# Patient Record
Sex: Female | Born: 1975 | Race: Black or African American | Hispanic: No | Marital: Single | Smoking: Never smoker
Health system: Southern US, Community
[De-identification: ages and names within clinical notes are randomized; demographics above are authoritative.]

---

## 2004-12-12 ENCOUNTER — Emergency Department: Payer: Self-pay | Admitting: Emergency Medicine

## 2006-07-26 ENCOUNTER — Emergency Department: Payer: Self-pay | Admitting: Emergency Medicine

## 2006-10-06 ENCOUNTER — Emergency Department: Payer: Self-pay | Admitting: Emergency Medicine

## 2007-11-14 HISTORY — PX: BREAST SURGERY: SHX581

## 2008-05-12 ENCOUNTER — Ambulatory Visit: Payer: Self-pay

## 2008-06-11 ENCOUNTER — Ambulatory Visit: Payer: Self-pay | Admitting: General Surgery

## 2009-06-27 ENCOUNTER — Emergency Department: Payer: Self-pay | Admitting: Internal Medicine

## 2009-09-13 ENCOUNTER — Emergency Department: Payer: Self-pay | Admitting: Emergency Medicine

## 2009-12-16 ENCOUNTER — Ambulatory Visit: Payer: Self-pay | Admitting: Family Medicine

## 2010-03-06 ENCOUNTER — Emergency Department: Payer: Self-pay | Admitting: Internal Medicine

## 2011-03-08 ENCOUNTER — Emergency Department: Payer: Self-pay | Admitting: Emergency Medicine

## 2012-02-15 ENCOUNTER — Emergency Department: Payer: Self-pay | Admitting: Emergency Medicine

## 2012-02-15 LAB — COMPREHENSIVE METABOLIC PANEL
Albumin: 3.5 g/dL (ref 3.4–5.0)
Alkaline Phosphatase: 45 U/L — ABNORMAL LOW (ref 50–136)
Anion Gap: 10 (ref 7–16)
Calcium, Total: 9.4 mg/dL (ref 8.5–10.1)
EGFR (Non-African Amer.): >60
Glucose: 65 mg/dL (ref 65–99)
Osmolality: 271 (ref 275–301)
SGOT(AST): 21 U/L (ref 15–37)
SGPT (ALT): 19 U/L
Sodium: 137 mmol/L (ref 136–145)

## 2012-02-15 LAB — CBC
MCH: 26 pg (ref 26.0–34.0)
MCHC: 32.3 g/dL (ref 32.0–36.0)
MCV: 81 fL (ref 80–100)
Platelet: 304 10*3/uL (ref 150–440)
RBC: 4.18 10*6/uL (ref 3.80–5.20)
WBC: 9.2 10*3/uL (ref 3.6–11.0)

## 2012-02-15 LAB — LIPASE, BLOOD: Lipase: 77 U/L (ref 73–393)

## 2012-02-15 LAB — HCG, QUANTITATIVE, PREGNANCY: Beta Hcg, Quant.: 165220 m[IU]/mL — ABNORMAL HIGH

## 2012-02-15 LAB — PROTIME-INR: Prothrombin Time: 13.5 secs (ref 11.5–14.7)

## 2012-03-09 ENCOUNTER — Emergency Department: Payer: Self-pay | Admitting: Emergency Medicine

## 2012-03-09 LAB — PREGNANCY, URINE: Pregnancy Test, Urine: POSITIVE m[IU]/mL

## 2012-03-09 LAB — URINALYSIS, COMPLETE
Blood: NEGATIVE
Leukocyte Esterase: NEGATIVE
Nitrite: NEGATIVE
Ph: 5 (ref 4.5–8.0)
Protein: 30
Specific Gravity: 1.027 (ref 1.003–1.030)
Squamous Epithelial: 3

## 2012-09-03 ENCOUNTER — Observation Stay: Payer: Self-pay

## 2012-09-05 ENCOUNTER — Ambulatory Visit: Payer: Self-pay | Admitting: Obstetrics and Gynecology

## 2012-09-05 LAB — CBC WITH DIFFERENTIAL/PLATELET
Basophil %: 0.4 %
Eosinophil #: 0 10*3/uL (ref 0.0–0.7)
HCT: 26.6 % — ABNORMAL LOW (ref 35.0–47.0)
HGB: 8.2 g/dL — ABNORMAL LOW (ref 12.0–16.0)
MCH: 20.9 pg — ABNORMAL LOW (ref 26.0–34.0)
MCHC: 30.8 g/dL — ABNORMAL LOW (ref 32.0–36.0)
MCV: 68 fL — ABNORMAL LOW (ref 80–100)
Monocyte #: 0.6 x10 3/mm (ref 0.2–0.9)
Monocyte %: 5.5 %
Neutrophil #: 9.4 10*3/uL — ABNORMAL HIGH (ref 1.4–6.5)

## 2012-09-06 ENCOUNTER — Inpatient Hospital Stay: Payer: Self-pay

## 2012-09-07 LAB — HEMATOCRIT: HCT: 25 % — ABNORMAL LOW (ref 35.0–47.0)

## 2012-10-08 ENCOUNTER — Emergency Department: Payer: Self-pay | Admitting: Emergency Medicine

## 2013-06-15 ENCOUNTER — Emergency Department: Payer: Self-pay | Admitting: Emergency Medicine

## 2015-03-02 NOTE — Op Note (Signed)
PATIENT NAME:  Martha Pittman, Martha Pittman MR#:  [id number] DATE OF BIRTH:  31-May-1976  DATE OF PROCEDURE:  09/06/2012  PREOPERATIVE DIAGNOSES:  1. Intrauterine pregnancy at [redacted] weeks gestational age.  2. History of previous Cesarean section, desires repeat.   POSTOPERATIVE DIAGNOSES: 1. Intrauterine pregnancy at [redacted] weeks gestational age.  2. History of previous Cesarean section, desires repeat.   PROCEDURE: Repeat low transverse Cesarean section via Pfannenstiel incision.   ANESTHESIA USED: Spinal.   SURGEON: Conard Novak, MD   ASSISTANT SURGEON: Annamarie Major, MD   ESTIMATED BLOOD LOSS: 500 mL.  OPERATIVE FLUIDS: 1000 mL Crystalloid.   COMPLICATIONS: None.   FINDINGS:  1. Minimal adhesions.  2. Normal appearing gravid uterus, fallopian tubes, and ovaries.   SPECIMENS: None.   CONDITION: Stable at the end of the procedure.   INDICATION FOR PROCEDURE: [name] is a 39 year old G22 P2-0-1-2 female with an estimated due date of 11/01 who desires a repeat Cesarean section after having undergone two prior Cesarean deliveries. For this she was taken to the operating room for abdominal delivery.   PROCEDURE IN DETAIL: The patient was taken to the operating room where spinal anesthesia was administered and found to be adequate. She was placed in the dorsal supine position with a leftward tilt and prepped and draped in the usual sterile fashion. A Pfannenstiel incision was made and carried through the various layers until the peritoneum was identified and entered sharply. The peritoneal incision was extended in cranial and caudal directions using tension. A bladder flap was created and the bladder retractor was placed to move the bladder out of the operative field of interest. A low transverse hysterotomy was made with the scalpel and extended in the lateral direction using cranial and caudal tension. The fetal vertex was then grasped, elevated to the hysterotomy, and delivered followed by the  shoulders and the rest of the body without difficulty. The cord was clamped and then cut and the infant was handed to the awaiting pediatrician. Cord blood was collected. The placenta was then removed and the uterus was exteriorized and cleared of all clot and debris. The hysterotomy was then closed using #0 Vicryl in a running locked fashion. A second layer of the same suture was used to obtain hemostasis. The uterus was returned to the abdomen and the gutters were cleared of all clots and debris. The rectus muscles and peritoneum were reapproximated using a single figure-of-eight stitch using a #0 Vicryl suture.   The On-Q pump system was then installed according to the manufacturer's recommendations. The catheters were inserted approximately 4 cm cephalad to the incision approximately 2 cm apart straddling the midline. The catheters were inserted to a depth of just superficial to the rectus abdominis muscles and just deep to the rectus fascia. The catheters were inserted to a depth of the third mark on the catheters.   With care to avoid the catheters, the fascia was closed using #0 Maxon using two separate sutures starting at the apices and meeting in the midline where the two sutures were tied. The skin was closed using the Insorb subcutaneous stapler.   The skin incision was then covered using Dermabond. The On-Q catheters were affixed to the skin using Dermabond followed by both Steri-Strips as well as Tegaderm. The On-Q catheters were bolused with 5 mL of 0.5% Marcaine prior to the end of procedure.   The patient tolerated the procedure well. Sponge, lap, and needle counts were correct x2. For VTE prophylaxis, the patient  had on compression hose and pneumatic compression stockings throughout the entire procedure. For antibiotics the patient received 2 grams of Ancef prior to skin incision.  ____________________________ Conard NovakStephen D. Duquan Gillooly, MD sdj:drc D: 09/06/2012 10:24:42 ET T: 09/06/2012  10:38:18 ET JOB#: [id number] Conard NovakSTEPHEN D Nemiah Bubar MD ELECTRONICALLY SIGNED 10/01/2012 22:41

## 2015-03-23 NOTE — H&P (Signed)
L&D Evaluation:  History:   HPI 39 year old BF G4 P20012  with EDC=09/13/2012 by an 11 week ultrasound presents at 4738 3/7 weeks with c/o pelvic pain/pressure that started yesterday. Movemnt and sitting tends to make the pain worse. She feels some tightening, but contractions not painful. Denies VB, LOF, dysuria, diarrhea. Has a hx of C-section x2 and repeat C-section scheduled for 10/25. She also states that she fell on her hands and knees last night when getting out of bed. [facility], AMA with negative first trimester screen, normal anatomy scan, and positive GBS    Presents with pelvic pressure and pain    Patient's Medical History No Chronic Illness    Patient's Surgical History Previous C-Section  x2, and breast biopsy.    Medications Pre [place]atal Vitamins    Social History none   Exam:   Vital Signs stable  115/68    General no apparent distress    Mental Status clear    Chest clear    Heart normal sinus rhythm, no murmur/gallop/rubs    Abdomen gravid, non-tender    Estimated Fetal Weight Average for gestational age    Fetal Position cephalic    Pelvic no external lesions, closed/50%/-3-no change over three hours    Mebranes Intact    FHT normal rate with no decels, 130s with accels to 150s-170, reactive NST    FHT Description CAT 1    Fetal Heart Rate 135    Ucx initially q5-7 min and mild, spaced out with IV hydration and one dose of terbutaline    Skin dry   Impression:   Impression Pelvic pain/pressure. Not in labor   Plan:   Plan DC home with labor precautions. Advised to stop working and avoid prolonged sitting and standing.FU in two days for proeop visit.   Electronic Signatures: Trinna BalloonGutierrez, Imanol Bihl L (CNM)  (Signed 22-Oct-13 18:19)  Authored: L&D Evaluation   Last Updated: 22-Oct-13 18:19 by Trinna BalloonGutierrez, Raymone Pembroke L (CNM)

## 2016-03-14 ENCOUNTER — Emergency Department
Admission: EM | Admit: 2016-03-14 | Discharge: 2016-03-14 | Disposition: A | Discharge status: Home / Self Care | Payer: BC Managed Care – PPO | Attending: Emergency Medicine | Admitting: Emergency Medicine

## 2016-03-14 ENCOUNTER — Encounter: Payer: Self-pay | Admitting: *Deleted

## 2016-03-14 DIAGNOSIS — M549 Dorsalgia, unspecified: Secondary | ICD-10-CM | POA: Insufficient documentation

## 2016-03-14 DIAGNOSIS — Z5321 Procedure and treatment not carried out due to patient leaving prior to being seen by health care provider: Secondary | ICD-10-CM | POA: Diagnosis not present

## 2016-03-14 NOTE — ED Notes (Signed)
No answer when called from lobby 

## 2016-03-14 NOTE — ED Provider Notes (Signed)
-----------------------------------------   4:44 AM on 03/14/2016 -----------------------------------------  Patient left without being seen. There was no answer when she was called from the lobby.  Irean HongJade J Sung, MD 03/14/16 [phone]
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
## 2016-07-18 ENCOUNTER — Encounter (HOSPITAL_COMMUNITY): Payer: Self-pay

## 2016-07-18 ENCOUNTER — Emergency Department (HOSPITAL_COMMUNITY)
Admission: EM | Admit: 2016-07-18 | Discharge: 2016-07-18 | Disposition: A | Discharge status: Home / Self Care | Payer: BC Managed Care – PPO | Attending: Emergency Medicine | Admitting: Emergency Medicine

## 2016-07-18 ENCOUNTER — Emergency Department (HOSPITAL_COMMUNITY): Payer: BC Managed Care – PPO

## 2016-07-18 DIAGNOSIS — S0990XA Unspecified injury of head, initial encounter: Secondary | ICD-10-CM | POA: Diagnosis present

## 2016-07-18 DIAGNOSIS — T148XXA Other injury of unspecified body region, initial encounter: Secondary | ICD-10-CM

## 2016-07-18 DIAGNOSIS — Y999 Unspecified external cause status: Secondary | ICD-10-CM | POA: Insufficient documentation

## 2016-07-18 DIAGNOSIS — Y939 Activity, unspecified: Secondary | ICD-10-CM | POA: Diagnosis not present

## 2016-07-18 DIAGNOSIS — S0081XA Abrasion of other part of head, initial encounter: Secondary | ICD-10-CM | POA: Insufficient documentation

## 2016-07-18 DIAGNOSIS — Y92410 Unspecified street and highway as the place of occurrence of the external cause: Secondary | ICD-10-CM | POA: Diagnosis not present

## 2016-07-18 MED ORDER — IBUPROFEN 400 MG PO TABS
600.0000 mg | ORAL_TABLET | Freq: Once | ORAL | Status: AC
  Administered 2016-07-18: 600 mg via ORAL
  Filled 2016-07-18: qty 1
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
## 2016-07-18 NOTE — ED Notes (Signed)
EDP at bedside  

## 2016-07-18 NOTE — ED Notes (Signed)
Patient transported to CT 
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
## 2019-12-17 ENCOUNTER — Other Ambulatory Visit: Payer: Self-pay

## 2019-12-17 ENCOUNTER — Emergency Department
Admission: EM | Admit: 2019-12-17 | Discharge: 2019-12-17 | Disposition: A | Discharge status: Home / Self Care | Payer: BC Managed Care – PPO | Attending: Emergency Medicine | Admitting: Emergency Medicine

## 2019-12-17 DIAGNOSIS — N390 Urinary tract infection, site not specified: Secondary | ICD-10-CM | POA: Insufficient documentation

## 2019-12-17 DIAGNOSIS — M7918 Myalgia, other site: Secondary | ICD-10-CM | POA: Insufficient documentation

## 2019-12-17 DIAGNOSIS — Z20822 Contact with and (suspected) exposure to covid-19: Secondary | ICD-10-CM | POA: Insufficient documentation

## 2019-12-17 DIAGNOSIS — R509 Fever, unspecified: Secondary | ICD-10-CM | POA: Diagnosis present

## 2019-12-17 LAB — URINALYSIS, COMPLETE (UACMP) WITH MICROSCOPIC
Bilirubin Urine: NEGATIVE
Glucose, UA: NEGATIVE mg/dL
Hgb urine dipstick: NEGATIVE
Ketones, ur: NEGATIVE mg/dL
Nitrite: NEGATIVE
Protein, ur: 100 mg/dL — AB
Specific Gravity, Urine: 1.025 (ref 1.005–1.030)
WBC, UA: >50 WBC/hpf — ABNORMAL HIGH (ref 0–5)
pH: 5 (ref 5.0–8.0)

## 2019-12-17 LAB — INFLUENZA PANEL BY PCR (TYPE A & B)
Influenza A By PCR: NEGATIVE
Influenza B By PCR: NEGATIVE

## 2019-12-17 MED ORDER — CEPHALEXIN 500 MG PO CAPS: 500.0000 mg | ORAL_CAPSULE | Freq: Three times a day (TID) | ORAL | 0 refills | Status: DC

## 2019-12-17 MED ORDER — ONDANSETRON 4 MG PO TBDP: 4.0000 mg | ORAL_TABLET | Freq: Three times a day (TID) | ORAL | 0 refills | Status: DC | PRN

## 2019-12-17 MED ORDER — ONDANSETRON 4 MG PO TBDP
4.0000 mg | ORAL_TABLET | Freq: Once | ORAL | Status: AC
  Administered 2019-12-17: 4 mg via ORAL
  Filled 2019-12-17: qty 1

## 2019-12-17 NOTE — ED Triage Notes (Signed)
Pt c/o nausea, fever, chills since Sunday and was tested negative for covid on Monday. Pt is in NAD.

## 2019-12-17 NOTE — ED Notes (Signed)
Patient is complaining of fever and body aches since Sunday.  Patient states temp was 103 on Sunday.  Patient reports cough that occasionally causes left lower abdominal pain.  Patient reports some nausea, denies vomiting and diarrhea.  Denies loss of taste and smell.  Denies sore throat.

## 2019-12-17 NOTE — ED Provider Notes (Signed)
[facility] Emergency Department Provider Note   ____________________________________________   First MD Initiated Contact with Patient 12/17/19 1031     (approximate)  I have reviewed the triage vital signs and the nursing notes.   HISTORY  Chief Complaint URI    HPI Martha Pittman is a 44 y.o. female presents to the ED with complaint of fever, body aches and temp of 103 4 days ago.  Patient states that she was tested Monday for Covid and was negative.  Patient states that this morning she is able to drink fluids but continues to be nauseous without vomiting.  She denies any changes in taste or smell.  There is been no diarrhea.  She is unaware of any exposure to Covid.  She rates her pain as an 8 out of 10.     History reviewed. No pertinent past medical history.  There are no problems to display for this patient.   Past Surgical History:  Procedure Laterality Date  . CESAREAN SECTION      Prior to Admission medications   Medication Sig Start Date End Date Taking? Authorizing Provider  cephALEXin (KEFLEX) 500 MG capsule Take 1 capsule (500 mg total) by mouth 3 (three) times daily. 12/17/19   Tommi Rumps, PA-C  ondansetron (ZOFRAN ODT) 4 MG disintegrating tablet Take 1 tablet (4 mg total) by mouth every 8 (eight) hours as needed for nausea or vomiting. 12/17/19   Tommi Rumps, PA-C    Allergies Patient has no known allergies.  No family history on file.  Social History Social History   Tobacco Use  . Smoking status: Never Smoker  . Smokeless tobacco: Never Used  Substance Use Topics  . Alcohol use: Yes    Comment: rare  . Drug use: No    Review of Systems Constitutional: Positive fever/chills Eyes: No visual changes. ENT: No sore throat. Cardiovascular: Denies chest pain. Respiratory: Denies shortness of breath.  Occasional cough. Gastrointestinal: No abdominal pain.  Positive nausea, no vomiting.  No diarrhea.  No  constipation. Genitourinary: Negative for dysuria. Musculoskeletal: Positive for body aches. Skin: Negative for rash. Neurological: Negative for headaches, focal weakness or numbness. ____________________________________________   PHYSICAL EXAM:  VITAL SIGNS: ED Triage Vitals  Enc Vitals Group     BP 12/17/19 0948 103/65     Pulse Rate 12/17/19 0948 (!) 118     Resp 12/17/19 0948 18     Temp 12/17/19 0948 99.5 F (37.5 C)     Temp Source 12/17/19 0948 Oral     SpO2 12/17/19 0948 98 %     Weight 12/17/19 0949 150 lb (68 kg)     Height 12/17/19 0949 5' (1.524 m)     Head Circumference --      Peak Flow --      Pain Score 12/17/19 0949 8     Pain Loc --      Pain Edu? --      Excl. in GC? --    Constitutional: Alert and oriented. Well appearing and in no acute distress. Eyes: Conjunctivae are normal.  Head: Atraumatic. Neck: No stridor.   Cardiovascular: Normal rate, regular rhythm. Grossly normal heart sounds.  Good peripheral circulation. Respiratory: Normal respiratory effort.  No retractions. Lungs CTAB. Gastrointestinal: Soft and nontender. No distention.  No CVA tenderness. Musculoskeletal: Moves upper and lower extremities any difficulty.  Normal gait was noted. Neurologic:  Normal speech and language. No gross focal neurologic deficits are appreciated. No  gait instability. Skin:  Skin is warm, dry and intact. No rash noted. Psychiatric: Mood and affect are normal. Speech and behavior are normal.  ____________________________________________   LABS (all labs ordered are listed, but only abnormal results are displayed)  Labs Reviewed  URINALYSIS, COMPLETE (UACMP) WITH MICROSCOPIC - Abnormal; Notable for the following components:      Result Value   Color, Urine AMBER (*)    APPearance TURBID (*)    Protein, ur 100 (*)    Leukocytes,Ua MODERATE (*)    WBC, UA >50 (*)    Bacteria, UA RARE (*)    Non Squamous Epithelial PRESENT (*)    All other components  within normal limits  INFLUENZA PANEL BY PCR (TYPE A & B)    PROCEDURES  Procedure(s) performed (including Critical Care):  Procedures   ____________________________________________   INITIAL IMPRESSION / ASSESSMENT AND PLAN / ED COURSE  As part of my medical decision making, I reviewed the following data within the electronic MEDICAL RECORD NUMBER Notes from prior ED visits and [place] Controlled Substance Database  Martha Pittman was evaluated in Emergency Department on 12/17/2019 for the symptoms described in the history of present illness. She was evaluated in the context of the global COVID-19 pandemic, which necessitated consideration that the patient might be at risk for infection with the SARS-CoV-2 virus that causes COVID-19. Institutional protocols and algorithms that pertain to the evaluation of patients at risk for COVID-19 are in a state of rapid change based on information released by regulatory bodies including the CDC and federal and state organizations. These policies and algorithms were followed during the patient's care in the ED.  44 year old female presents to the ED with complaint of 4 days of fever, body aches and nausea without vomiting.  She had a Covid test done on Monday which was negative.  She is unaware of any Covid exposure.  Patient continues to have nausea without vomiting.  Influenza test was negative.  Urinalysis is consistent with an acute UTI.  Patient was made aware.  She was given Zofran while in the ED and is feeling much better.  A prescription for Keflex 500 mg 3 times daily and Zofran as needed was sent to her pharmacy.  She is encouraged to increase fluids and continue Tylenol as needed.  She will follow-up with her PCP if any continued problems and return to the emergency department if any severe worsening of her symptoms.   ____________________________________________   FINAL CLINICAL IMPRESSION(S) / ED DIAGNOSES  Final diagnoses:  Acute urinary tract  infection     ED Discharge Orders         Ordered    cephALEXin (KEFLEX) 500 MG capsule  3 times daily     12/17/19 1257    ondansetron (ZOFRAN ODT) 4 MG disintegrating tablet  Every 8 hours PRN     12/17/19 1257           Note:  This document was prepared using Dragon voice recognition software and may include unintentional dictation errors.    Johnn Hai, PA-C 12/17/19 1303    Lavonia Drafts, MD 12/17/19 1304

## 2019-12-17 NOTE — Discharge Instructions (Signed)
Follow-up with your primary care provider if any continued problems or concerns.  Increase fluids.  Tylenol or ibuprofen if fever or body aches.  Begin taking antibiotic for the next 10 days until completely finished.  The Zofran can be used every 8 hours if needed for nausea.  Increase fluids.  If any severe worsening of your symptoms such as fever, chills, back pain or inability to take the antibiotics return to the emergency department.

## 2020-02-29 NOTE — Progress Notes (Deleted)
PCP:  [facility]   No chief complaint on file.  ?NP> 3  HPI:      [name] is a 44 y.o. No obstetric history on file. who LMP was No LMP recorded., presents today for her NP> 3 yrs annual examination.  Her menses are {norm/abn:715}, lasting {number:22536} days.  Dysmenorrhea {dysmen:716}. She {does:18564} have intermenstrual bleeding.  Sex activity: {sex active:315163}.  Last Pap: {QMGQ:676195093}  Results were: {norm/abn:16707::"no abnormalities"} /neg HPV DNA *** Hx of STDs: {STD hx:14358}  Last mammogram: {date:304500300}  Results were: {norm/abn:13465} There is no FH of breast cancer. There is no FH of ovarian cancer. The patient {does:18564} do self-breast exams.  Tobacco use: {tob:20664} Alcohol use: {Alcohol:11675} No drug use.  Exercise: {exercise:31265}  She {does:18564} get adequate calcium and Vitamin D in her diet.   No past medical history on file.  Past Surgical History:  Procedure Laterality Date  . BREAST SURGERY  2009   Cyst removed from left breast  . CESAREAN SECTION      Family History  Problem Relation Age of Onset  . Breast cancer Mother 62  . Lung cancer Maternal Grandmother     Social History   Socioeconomic History  . Marital status: Single    Spouse name: Not on file  . Number of children: Not on file  . Years of education: Not on file  . Highest education level: Not on file  Occupational History  . Not on file  Tobacco Use  . Smoking status: Never Smoker  . Smokeless tobacco: Never Used  Substance and Sexual Activity  . Alcohol use: Yes    Comment: rare  . Drug use: No  . Sexual activity: Not on file  Other Topics Concern  . Not on file  Social History Narrative  . Not on file   Social Determinants of Health   Financial Resource Strain:   . Difficulty of Paying Living Expenses:   Food Insecurity:   . Worried About Programme researcher, broadcasting/film/video in the Last Year:   . Barista in the Last Year:     Transportation Needs:   . Freight forwarder (Medical):   Marland Kitchen Lack of Transportation (Non-Medical):   Physical Activity:   . Days of Exercise per Week:   . Minutes of Exercise per Session:   Stress:   . Feeling of Stress :   Social Connections:   . Frequency of Communication with Friends and Family:   . Frequency of Social Gatherings with Friends and Family:   . Attends Religious Services:   . Active Member of Clubs or Organizations:   . Attends Banker Meetings:   Marland Kitchen Marital Status:   Intimate Partner Violence:   . Fear of Current or Ex-Partner:   . Emotionally Abused:   Marland Kitchen Physically Abused:   . Sexually Abused:      Current Outpatient Medications:  .  cephALEXin (KEFLEX) 500 MG capsule, Take 1 capsule (500 mg total) by mouth 3 (three) times daily., Disp: 30 capsule, Rfl: 0 .  ondansetron (ZOFRAN ODT) 4 MG disintegrating tablet, Take 1 tablet (4 mg total) by mouth every 8 (eight) hours as needed for nausea or vomiting., Disp: 8 tablet, Rfl: 0     ROS:  Review of Systems BREAST: No symptoms   Objective: There were no vitals taken for this visit.   OBGyn Exam  Results: No results found for this or any previous visit (from the  past 24 hour(s)).  Assessment/Plan: No diagnosis found.  No orders of the defined types were placed in this encounter.            GYN counsel {counseling:16159}     F/U  No follow-ups on file.  Lex Linhares B. Samantha Ragen, PA-C 02/29/2020 6:21 PM

## 2020-03-01 ENCOUNTER — Ambulatory Visit: Payer: Self-pay | Admitting: Obstetrics and Gynecology

## 2020-03-16 NOTE — Patient Instructions (Addendum)
I value your feedback and entrusting us with your care. If you get a  patient survey, I would appreciate you taking the time to let us know about your experience today. Thank you!  As of October 23, 2019, your lab results will be released to your MyChart immediately, before I even have a chance to see them. Please give me time to review them and contact you if there are any abnormalities. Thank you for your patience.   [facility]: [phone]  [facility]: [phone]  

## 2020-03-16 NOTE — Progress Notes (Signed)
PCP:  [facility]   Chief Complaint  Patient presents with  . Gynecologic Exam     HPI:      [name] is a 44 y.o. No obstetric history on file. who LMP was Patient's last menstrual period was 03/03/2020 (approximate)., presents today for her NP> 3 yrs annual examination.  Her menses are regular every 28-30 days, lasting 3 days.  Dysmenorrhea none. She does not have intermenstrual bleeding.  Sex activity: not sexually active for many yrs. Mirena placed 11/05/12. Pt went to [facility] to have it removed and since strings not visible, was told it had come out on its own. No GYN u/s done.  Last Pap: not recent; no hx of abn  Last mammogram: 2011  Results were: normal--routine follow-up in 12 months There is a FH of breast cancer in her mom, genetic testing not indicated. There is no FH of ovarian cancer. The patient does do self-breast exams.  Tobacco use: The patient denies current or previous tobacco use. Alcohol use: social drinker No drug use.  Exercise: moderately active  She does get adequate calcium but not Vitamin D in her diet.  No recent fasting labs.   History reviewed. No pertinent past medical history.  Past Surgical History:  Procedure Laterality Date  . BREAST SURGERY  2009   Cyst removed from left breast  . CESAREAN SECTION       Family History  Problem Relation Age of Onset  . Breast cancer Mother 77  . Lung cancer Maternal Grandmother   . Ovarian cancer Neg Hx     Social History   Socioeconomic History  . Marital status: Single    Spouse name: Not on file  . Number of children: Not on file  . Years of education: Not on file  . Highest education level: Not on file  Occupational History  . Not on file  Tobacco Use  . Smoking status: Never Smoker  . Smokeless tobacco: Never Used  Substance and Sexual Activity  . Alcohol use: Yes    Comment: rare  . Drug use: No  . Sexual activity: Yes    Birth control/protection: None    Other Topics Concern  . Not on file  Social History Narrative  . Not on file   Social Determinants of Health   Financial Resource Strain:   . Difficulty of Paying Living Expenses:   Food Insecurity:   . Worried About Programme researcher, broadcasting/film/video in the Last Year:   . Barista in the Last Year:   Transportation Needs:   . Freight forwarder (Medical):   Marland Kitchen Lack of Transportation (Non-Medical):   Physical Activity:   . Days of Exercise per Week:   . Minutes of Exercise per Session:   Stress:   . Feeling of Stress :   Social Connections:   . Frequency of Communication with Friends and Family:   . Frequency of Social Gatherings with Friends and Family:   . Attends Religious Services:   . Active Member of Clubs or Organizations:   . Attends Banker Meetings:   Marland Kitchen Marital Status:   Intimate Partner Violence:   . Fear of Current or Ex-Partner:   . Emotionally Abused:   Marland Kitchen Physically Abused:   . Sexually Abused:     No current outpatient medications on file.   ROS:  Review of Systems  Constitutional: Negative for fatigue, fever and unexpected weight change.  Respiratory:  Negative for cough, shortness of breath and wheezing.   Cardiovascular: Negative for chest pain, palpitations and leg swelling.  Gastrointestinal: Negative for blood in stool, constipation, diarrhea, nausea and vomiting.  Endocrine: Negative for cold intolerance, heat intolerance and polyuria.  Genitourinary: Negative for dyspareunia, dysuria, flank pain, frequency, genital sores, hematuria, menstrual problem, pelvic pain, urgency, vaginal bleeding, vaginal discharge and vaginal pain.  Musculoskeletal: Negative for back pain, joint swelling and myalgias.  Skin: Negative for rash.  Neurological: Negative for dizziness, syncope, light-headedness, numbness and headaches.  Hematological: Negative for adenopathy.  Psychiatric/Behavioral: Negative for agitation, confusion, sleep disturbance and  suicidal ideas. The patient is not nervous/anxious.   BREAST: No symptoms   Objective: BP 114/70   Ht 4\' 11"  (1.499 m)   Wt 168 lb (76.2 kg)   LMP 03/03/2020 (Approximate)   BMI 33.93 kg/m    Physical Exam Constitutional:      Appearance: She is well-developed.  Genitourinary:     Vulva, vagina, cervix, uterus, right adnexa and left adnexa normal.     No vulval lesion or tenderness noted.     No vaginal discharge, erythema or tenderness.     No cervical polyp.     Uterus is not enlarged or tender.     No right or left adnexal mass present.     Right adnexa not tender.     Left adnexa not tender.  Neck:     Thyroid: No thyromegaly.  Cardiovascular:     Rate and Rhythm: Normal rate and regular rhythm.     Heart sounds: Normal heart sounds. No murmur.  Pulmonary:     Effort: Pulmonary effort is normal.     Breath sounds: Normal breath sounds.  Chest:     Breasts:        Right: No mass, nipple discharge, skin change or tenderness.        Left: No mass, nipple discharge, skin change or tenderness.  Abdominal:     Palpations: Abdomen is soft.     Tenderness: There is no abdominal tenderness. There is no guarding.  Musculoskeletal:        General: Normal range of motion.     Cervical back: Normal range of motion.  Neurological:     General: No focal deficit present.     Mental Status: She is alert and oriented to person, place, and time.     Cranial Nerves: No cranial nerve deficit.  Skin:    General: Skin is warm and dry.  Psychiatric:        Mood and Affect: Mood normal.        Behavior: Behavior normal.        Thought Content: Thought content normal.        Judgment: Judgment normal.  Vitals reviewed.     Assessment/Plan: Encounter for annual routine gynecological examination  Cervical cancer screening - Plan: Cytology - PAP  Screening for HPV (human papillomavirus) - Plan: Cytology - PAP  Encounter for screening mammogram for malignant neoplasm of breast  - Plan: MM 3D SCREEN BREAST BILATERAL; pt to sched mammo  Family history of breast cancer--doesn't qualify for cancer genetic testing.  Intrauterine contraceptive device threads lost, initial encounter - Plan: US PELVIS TRANSVAGINAL NON-OB (TV ONLY); check GYN u/s for IUD placement.   Blood tests for routine general physical examination - Plan: Comprehensive metabolic panel, Lipid panel, Hemoglobin A1c  Screening cholesterol level - Plan: Lipid panel  BMI 33.0-33.9,adult - Plan: Hemoglobin A1c  Screening  for diabetes mellitus - Plan: Hemoglobin A1c            GYN counsel breast self exam, mammography screening, adequate intake of calcium and vitamin D, diet and exercise     F/U  Return in about 1 day (around 03/18/2020) for GYN u/s for IUD placement--appt with [facility] after; may want to do fasting labs at appt.  Trevelle Mcgurn B. Declan Adamson, PA-C 03/17/2020 9:12 AM

## 2020-03-17 ENCOUNTER — Other Ambulatory Visit (HOSPITAL_COMMUNITY)
Admission: RE | Admit: 2020-03-17 | Discharge: 2020-03-17 | Disposition: A | Discharge status: Home / Self Care | Payer: BC Managed Care – PPO | Source: Ambulatory Visit | Attending: Obstetrics and Gynecology | Admitting: Obstetrics and Gynecology

## 2020-03-17 ENCOUNTER — Ambulatory Visit (INDEPENDENT_AMBULATORY_CARE_PROVIDER_SITE_OTHER): Payer: BC Managed Care – PPO | Admitting: Obstetrics and Gynecology

## 2020-03-17 ENCOUNTER — Other Ambulatory Visit: Payer: Self-pay

## 2020-03-17 ENCOUNTER — Encounter: Payer: Self-pay | Admitting: Obstetrics and Gynecology

## 2020-03-17 VITALS — BP 114/70 | Ht 59.0 in | Wt 168.0 lb

## 2020-03-17 DIAGNOSIS — Z0000 Encounter for general adult medical examination without abnormal findings: Secondary | ICD-10-CM

## 2020-03-17 DIAGNOSIS — Z1151 Encounter for screening for human papillomavirus (HPV): Secondary | ICD-10-CM

## 2020-03-17 DIAGNOSIS — Z1231 Encounter for screening mammogram for malignant neoplasm of breast: Secondary | ICD-10-CM

## 2020-03-17 DIAGNOSIS — Z124 Encounter for screening for malignant neoplasm of cervix: Secondary | ICD-10-CM | POA: Insufficient documentation

## 2020-03-17 DIAGNOSIS — Z01419 Encounter for gynecological examination (general) (routine) without abnormal findings: Secondary | ICD-10-CM | POA: Diagnosis not present

## 2020-03-17 DIAGNOSIS — T8332XA Displacement of intrauterine contraceptive device, initial encounter: Secondary | ICD-10-CM

## 2020-03-17 DIAGNOSIS — Z803 Family history of malignant neoplasm of breast: Secondary | ICD-10-CM

## 2020-03-17 DIAGNOSIS — Z131 Encounter for screening for diabetes mellitus: Secondary | ICD-10-CM

## 2020-03-17 DIAGNOSIS — Z6833 Body mass index (BMI) 33.0-33.9, adult: Secondary | ICD-10-CM

## 2020-03-17 DIAGNOSIS — Z13220 Encounter for screening for lipoid disorders: Secondary | ICD-10-CM

## 2020-03-19 LAB — CYTOLOGY - PAP
Comment: NEGATIVE
Diagnosis: NEGATIVE
Diagnosis: REACTIVE
High risk HPV: NEGATIVE

## 2020-03-23 ENCOUNTER — Ambulatory Visit (INDEPENDENT_AMBULATORY_CARE_PROVIDER_SITE_OTHER): Payer: BC Managed Care – PPO

## 2020-03-23 ENCOUNTER — Other Ambulatory Visit: Payer: Self-pay

## 2020-03-23 ENCOUNTER — Ambulatory Visit (INDEPENDENT_AMBULATORY_CARE_PROVIDER_SITE_OTHER): Payer: BC Managed Care – PPO | Admitting: Obstetrics and Gynecology

## 2020-03-23 ENCOUNTER — Encounter: Payer: Self-pay | Admitting: Obstetrics and Gynecology

## 2020-03-23 ENCOUNTER — Other Ambulatory Visit: Payer: BC Managed Care – PPO

## 2020-03-23 VITALS — BP 100/70 | Ht 59.0 in | Wt 169.0 lb

## 2020-03-23 DIAGNOSIS — T8332XD Displacement of intrauterine contraceptive device, subsequent encounter: Secondary | ICD-10-CM

## 2020-03-23 DIAGNOSIS — Z0000 Encounter for general adult medical examination without abnormal findings: Secondary | ICD-10-CM

## 2020-03-23 DIAGNOSIS — Z6833 Body mass index (BMI) 33.0-33.9, adult: Secondary | ICD-10-CM

## 2020-03-23 DIAGNOSIS — Z538 Procedure and treatment not carried out for other reasons: Secondary | ICD-10-CM

## 2020-03-23 DIAGNOSIS — Z131 Encounter for screening for diabetes mellitus: Secondary | ICD-10-CM

## 2020-03-23 DIAGNOSIS — Z975 Presence of (intrauterine) contraceptive device: Secondary | ICD-10-CM

## 2020-03-23 DIAGNOSIS — T8332XA Displacement of intrauterine contraceptive device, initial encounter: Secondary | ICD-10-CM

## 2020-03-23 DIAGNOSIS — Z13220 Encounter for screening for lipoid disorders: Secondary | ICD-10-CM

## 2020-03-23 NOTE — Progress Notes (Signed)
   Chief Complaint  Patient presents with  . IUD removal    not interested in new [facility]     History of Present Illness:  Martha Pittman is a 44 y.o. that had a Mirena IUD placed approximately 7 1/2 years ago. Since that time, she denies dyspareunia, pelvic pain, non-menstrual bleeding, vaginal d/c, heavy bleeding. Had NP annual 03/17/20. Was told by health dept in past that since IUD strings no longer visible, IUD had fallen out. Pt had GYN u/s for placement today and IUD in correct location in uterus. Here for removal. Not sex active, doesn't need BC. Menses monthly, no pelvic pain.    BP 100/70   Ht 4\' 11"  (1.499 m)   Wt 169 lb (76.7 kg)   LMP 03/16/2020 (Approximate)   BMI 34.13 kg/m   Pelvic exam:  Two IUD strings absent from the cervical os. EGBUS, vaginal vault and cervix: within normal limits  IUD Removal Strings of IUD not identified. External cx os very small and hard for [facility] to penetrate into cx os. IUD hook unable to penetrate ext os.  IUD unable to be removed.   ULTRASOUND REPORT  Location: [facility]  Date of Service: 03/23/2020    Indications: IUD check  Findings:  The uterus is anteverted and measures 7.6 x 6.1 x 4.3 cm. Echo texture is homogenous without evidence of focal masses. The Endometrium measures 2.3 mm. The IUD is correctly placed within the uterus.   Right Ovary measures 4.0 x 2.6 x 1.3 cm. It is normal in appearance. Left Ovary measures 3.1 x 2.2 x 2.1 cm. It is normal in appearance. Survey of the adnexa demonstrates no adnexal masses. There is no free fluid in the cul de sac.  Impression: 1. Normal pelvic ultrasound.  2. The IUD  Is correctly placed within the uterus.   Recommendations: 1.Clinical correlation with the patient's History and Physical Exam.   05/23/2020, RT  Assessment:   Intrauterine contraceptive device threads lost, subsequent encounter--IUD in correct location per GYN u/s.   Attempted IUD  removal, unsuccessful--unable to penetrate ext cx os effectively for removal. Discussed trying to remove during menses and after cytotec use vs hysteroscopy with removal vs leaving in place since not interested in conception and not having any problems. Pt prefers to leave it in place for now. F/u prn pain/irregular bleeding.   F/u prn.  Chrisha Vogel B. Shantoya Geurts, PA-C 03/23/2020 9:32 AM

## 2020-03-23 NOTE — Patient Instructions (Signed)
I value your feedback and entrusting us with your care. If you get a  patient survey, I would appreciate you taking the time to let us know about your experience today. Thank you!  As of October 23, 2019, your lab results will be released to your MyChart immediately, before I even have a chance to see them. Please give me time to review them and contact you if there are any abnormalities. Thank you for your patience.  

## 2020-03-24 LAB — COMPREHENSIVE METABOLIC PANEL
ALT: 14 IU/L (ref 0–32)
AST: 18 IU/L (ref 0–40)
Albumin/Globulin Ratio: 1.6 (ref 1.2–2.2)
Albumin: 4.1 g/dL (ref 3.8–4.8)
Alkaline Phosphatase: 74 IU/L (ref 39–117)
BUN/Creatinine Ratio: 16 (ref 9–23)
BUN: 11 mg/dL (ref 6–24)
Bilirubin Total: <0.2 mg/dL (ref 0.0–1.2)
CO2: 24 mmol/L (ref 20–29)
Calcium: 9.8 mg/dL (ref 8.7–10.2)
Chloride: 103 mmol/L (ref 96–106)
Creatinine, Ser: 0.69 mg/dL (ref 0.57–1.00)
GFR calc Af Amer: 122 mL/min/{1.73_m2} (ref >59)
GFR calc non Af Amer: 106 mL/min/{1.73_m2} (ref >59)
Globulin, Total: 2.6 g/dL (ref 1.5–4.5)
Glucose: 79 mg/dL (ref 65–99)
Potassium: 4.6 mmol/L (ref 3.5–5.2)
Sodium: 137 mmol/L (ref 134–144)
Total Protein: 6.7 g/dL (ref 6.0–8.5)

## 2020-03-24 LAB — LIPID PANEL
Chol/HDL Ratio: 3.6 ratio (ref 0.0–4.4)
Cholesterol, Total: 167 mg/dL (ref 100–199)
HDL: 46 mg/dL (ref >39)
LDL Chol Calc (NIH): 112 mg/dL — ABNORMAL HIGH (ref 0–99)
Triglycerides: 45 mg/dL (ref 0–149)
VLDL Cholesterol Cal: 9 mg/dL (ref 5–40)

## 2020-03-24 LAB — HEMOGLOBIN A1C
Est. average glucose Bld gHb Est-mCnc: 103 mg/dL
Hgb A1c MFr Bld: 5.2 % (ref 4.8–5.6)

## 2020-03-24 NOTE — Progress Notes (Signed)
Pt aware.

## 2020-03-24 NOTE — Progress Notes (Signed)
Pls let pt know labs normal. Borderline LDL (bad cholesterol), but nothing needs to be done at this time. Rechk in 1 yr.

## 2020-11-03 ENCOUNTER — Emergency Department
Admission: EM | Admit: 2020-11-03 | Discharge: 2020-11-03 | Discharge status: Against Medical Advice | Payer: BC Managed Care – PPO

## 2020-11-03 NOTE — ED Triage Notes (Signed)
Called x's 3 for triage, no response

## 2020-11-03 NOTE — ED Triage Notes (Signed)
Pt called from [facility] to triage room, no response

## 2020-11-03 NOTE — ED Notes (Signed)
Pt called x's 3, no response ?

## 2020-12-07 ENCOUNTER — Other Ambulatory Visit: Payer: Self-pay

## 2020-12-07 ENCOUNTER — Ambulatory Visit (INDEPENDENT_AMBULATORY_CARE_PROVIDER_SITE_OTHER): Payer: BC Managed Care – PPO | Admitting: Obstetrics and Gynecology

## 2020-12-07 ENCOUNTER — Encounter: Payer: Self-pay | Admitting: Obstetrics and Gynecology

## 2020-12-07 VITALS — BP 110/80 | Ht 59.0 in | Wt 180.0 lb

## 2020-12-07 DIAGNOSIS — B9689 Other specified bacterial agents as the cause of diseases classified elsewhere: Secondary | ICD-10-CM | POA: Insufficient documentation

## 2020-12-07 DIAGNOSIS — T8332XD Displacement of intrauterine contraceptive device, subsequent encounter: Secondary | ICD-10-CM | POA: Diagnosis not present

## 2020-12-07 DIAGNOSIS — N760 Acute vaginitis: Secondary | ICD-10-CM | POA: Diagnosis not present

## 2020-12-07 LAB — POCT WET PREP WITH KOH
Clue Cells Wet Prep HPF POC: POSITIVE
KOH Prep POC: POSITIVE — AB
Trichomonas, UA: NEGATIVE
Yeast Wet Prep HPF POC: NEGATIVE

## 2020-12-07 MED ORDER — METRONIDAZOLE 500 MG PO TABS: 500.0000 mg | ORAL_TABLET | Freq: Two times a day (BID) | ORAL | 0 refills | Status: AC

## 2020-12-07 MED ORDER — MISOPROSTOL 100 MCG PO TABS: 100.0000 ug | ORAL_TABLET | Freq: Once | ORAL | 0 refills | Status: DC

## 2020-12-07 NOTE — Progress Notes (Signed)
[facility]   Chief Complaint  Patient presents with  . Vaginal Discharge    Sour odor, no itchiness or irritation x 2 days    HPI:      Martha Pittman is a 45 y.o. [id number] whose LMP was Patient's last menstrual period was 11/19/2020 (approximate)., presents today for increased vag d/c with sour odor, no irritation for 2 days. Hx of BV in past. No meds to treat, no recent abx use. No change in products, no urin sx, no LBP, pelvic pain, fevers. No new sex partners.  Has Mirena IUD, placed 8 yrs ago. Hx of lost IUD strings and unable to penetrate cx os with [facility] or IUD hook at 5/21 appt to remove. Pt elected to leave in place at that time, but agrees to removal now.    History reviewed. No pertinent past medical history.  Past Surgical History:  Procedure Laterality Date  . BREAST SURGERY  2009   Cyst removed from left breast  . CESAREAN SECTION      Family History  Problem Relation Age of Onset  . Breast cancer Mother 70  . Lung cancer Maternal Grandmother   . Ovarian cancer Neg Hx     Social History   Socioeconomic History  . Marital status: Single    Spouse name: Not on file  . Number of children: Not on file  . Years of education: Not on file  . Highest education level: Not on file  Occupational History  . Not on file  Tobacco Use  . Smoking status: Never Smoker  . Smokeless tobacco: Never Used  Vaping Use  . Vaping Use: Never used  Substance and Sexual Activity  . Alcohol use: Yes    Comment: rare  . Drug use: No  . Sexual activity: Yes    Birth control/protection: I.U.D.  Other Topics Concern  . Not on file  Social History Narrative  . Not on file   Social Determinants of Health   Financial Resource Strain: Not on file  Food Insecurity: Not on file  Transportation Needs: Not on file  Physical Activity: Not on file  Stress: Not on file  Social Connections: Not on file  Intimate Partner Violence: Not on file    No  outpatient medications prior to visit.   No facility-administered medications prior to visit.      ROS:  Review of Systems  Constitutional: Negative for fever, malaise/fatigue and weight loss.  Gastrointestinal: Negative for blood in stool, constipation, diarrhea, nausea and vomiting.  Genitourinary: Positive for vaginal discharge. Negative for dyspareunia, dysuria, flank pain, frequency, hematuria, urgency, vaginal bleeding and vaginal pain.  Musculoskeletal: Negative for back pain.  Skin: Negative for itching and rash.    OBJECTIVE:   Vitals:  BP 110/80   Ht 4\' 11"  (1.499 m)   Wt 180 lb (81.6 kg)   LMP 11/19/2020 (Approximate)   BMI 36.36 kg/m   Physical Exam Vitals reviewed.  Constitutional:      Appearance: She is well-developed.  Pulmonary:     Effort: Pulmonary effort is normal.  Genitourinary:    General: Normal vulva.     Pubic Area: No rash.      Labia:        Right: No rash, tenderness or lesion.        Left: No rash, tenderness or lesion.      Vagina: Vaginal discharge present. No erythema or tenderness.     Cervix: Normal.  Uterus: Normal. Not enlarged and not tender.      Adnexa: Right adnexa normal and left adnexa normal.       Right: No mass or tenderness.         Left: No mass or tenderness.       Comments: IUD STRINGS NOT IN CX OS; UNABLE TO GET THEM OUT WITH BRUSH; UNABLE TO PENETRATE CX OS Musculoskeletal:        General: Normal range of motion.     Cervical back: Normal range of motion.  Skin:    General: Skin is warm and dry.  Neurological:     General: No focal deficit present.     Mental Status: She is alert and oriented to person, place, and time.  Psychiatric:        Mood and Affect: Mood normal.        Behavior: Behavior normal.        Thought Content: Thought content normal.        Judgment: Judgment normal.     Results: Results for orders placed or performed in visit on 12/07/20 (from the past 24 hour(s))  POCT Wet Prep  with KOH     Status: Abnormal   Collection Time: 12/07/20  4:54 PM  Result Value Ref Range   Trichomonas, UA Negative    Clue Cells Wet Prep HPF POC pos    Epithelial Wet Prep HPF POC     Yeast Wet Prep HPF POC neg    Bacteria Wet Prep HPF POC     RBC Wet Prep HPF POC     WBC Wet Prep HPF POC     KOH Prep POC Positive (A) Negative     Assessment/Plan: BV (bacterial vaginosis) - Plan: POCT Wet Prep with KOH, metroNIDAZOLE (FLAGYL) 500 MG tablet; pos sx and wet prep. Rx flagyl, no EtOH. Will RF if sx recur. F/u prn.  Intrauterine contraceptive device threads lost, subsequent encounter - Plan: misoprostol (CYTOTEC) 100 MCG tablet; Unable to penetrate cx os today. Rx cytotec, RTO with MD for removal. May need dilators.    Meds ordered this encounter  Medications  . misoprostol (CYTOTEC) 100 MCG tablet    Sig: Take 1 tablet (100 mcg total) by mouth once for 1 dose. 12 hours before appt    Dispense:  1 tablet    Refill:  0    Order Specific Question:   Supervising Provider    Answer:   Nadara Mustard [id number]  . metroNIDAZOLE (FLAGYL) 500 MG tablet    Sig: Take 1 tablet (500 mg total) by mouth 2 (two) times daily for 7 days.    Dispense:  14 tablet    Refill:  0    Order Specific Question:   Supervising Provider    Answer:   Nadara Mustard [[id number]]      Return for IUD removal with MD/need dilators.  Eliazer Hemphill B. Khyran Riera, PA-C 12/07/2020 4:55 PM

## 2020-12-07 NOTE — Patient Instructions (Signed)
I value your feedback and you entrusting us with your care. If you get a [facility] patient survey, I would appreciate you taking the time to let us know about your experience today. Thank you! ? ? ?

## 2020-12-13 ENCOUNTER — Encounter: Payer: Self-pay | Admitting: Obstetrics and Gynecology

## 2020-12-13 ENCOUNTER — Other Ambulatory Visit: Payer: Self-pay

## 2020-12-13 ENCOUNTER — Ambulatory Visit (INDEPENDENT_AMBULATORY_CARE_PROVIDER_SITE_OTHER): Payer: BC Managed Care – PPO | Admitting: Obstetrics and Gynecology

## 2020-12-13 VITALS — BP 114/70 | Ht 59.0 in | Wt 177.2 lb

## 2020-12-13 DIAGNOSIS — Z538 Procedure and treatment not carried out for other reasons: Secondary | ICD-10-CM

## 2020-12-13 DIAGNOSIS — Z30432 Encounter for removal of intrauterine contraceptive device: Secondary | ICD-10-CM | POA: Diagnosis not present

## 2020-12-13 DIAGNOSIS — T8339XS Other mechanical complication of intrauterine contraceptive device, sequela: Secondary | ICD-10-CM

## 2020-12-13 DIAGNOSIS — Z975 Presence of (intrauterine) contraceptive device: Secondary | ICD-10-CM

## 2020-12-13 MED ORDER — MISOPROSTOL 100 MCG PO TABS: 400.0000 ug | ORAL_TABLET | Freq: Once | ORAL | 0 refills | Status: DC

## 2020-12-13 NOTE — Progress Notes (Signed)
  History of Present Illness:  Martha Pittman is a 45 y.o. that had a Mirena IUD placed approximately 8 years ago. Since that time, she states that she has failed two prior attempts to removed the IUD. She took 100 mcg of cytotec yesterday.  The following portions of the patient's history were reviewed and updated as appropriate: allergies, current medications, past family history, past medical history, past social history, past surgical history and problem list.  Patient Active Problem List   Diagnosis Date Noted  . BV (bacterial vaginosis) 12/07/2020  . Family history of breast cancer 03/17/2020   Medications:  Current Outpatient Medications on File Prior to Visit  Medication Sig Dispense Refill  . metroNIDAZOLE (FLAGYL) 500 MG tablet Take 1 tablet (500 mg total) by mouth 2 (two) times daily for 7 days. 14 tablet 0  . misoprostol (CYTOTEC) 100 MCG tablet Take 1 tablet (100 mcg total) by mouth once for 1 dose. 12 hours before appt 1 tablet 0   No current facility-administered medications on file prior to visit.   Allergies: has No Known Allergies.  Physical Exam:  BP 114/70   Ht 4\' 11"  (1.499 m)   Wt 177 lb 3.2 oz (80.4 kg)   LMP 11/19/2020 (Approximate)   BMI 35.79 kg/m  Body mass index is 35.79 kg/m. Constitutional: Well nourished, well developed female in no acute distress.  Abdomen: diffusely non tender to palpation, non distended, and no masses, hernias Neuro: Grossly intact Psych:  Normal mood and affect.    Pelvic exam:  Two IUD strings absent.  vaginal vault and cervix: within normal limits  IUD Removal Strings of IUD not able to be identified and grasped.  Cervix gently dilated. Could not enter the internal cervical os. IUD no able to be removed  Assessment: IUD Removal UNSUCCESSFUL  Plan: IUD unable to be removed. Will plan for hysteroscopy and IUD removal.  Given rx for 400 mcg cytotec to take the night before the procedure. She was amenable to this  plan.  01/17/2021 MD, [facility] 12/13/2020 4:25 PM

## 2020-12-13 NOTE — Patient Instructions (Signed)
Hysteroscopy Hysteroscopy is a procedure used to look inside a woman's womb (uterus). This may be done for various reasons, including:  To look for tumors and other growths in the uterus.  To evaluate abnormal bleeding, fibroid tumors, polyps, scar tissue, or uterine cancer.  To determine why a woman is unable to get pregnant or has had repeated pregnancy losses.  To locate an IUD (intrauterine device).  To place a birth control device into the fallopian tubes. During this procedure, a thin, flexible tube with a small light and camera (hysteroscope) is used to examine the uterus. The camera sends images to a monitor in the room so that your health care provider can view the inside of your uterus. A hysteroscopy should be done right after a menstrual period. Tell a health care provider about:  Any allergies you have.  All medicines you are taking, including vitamins, herbs, eye drops, creams, and over-the-counter medicines.  Any problems you or family members have had with anesthetic medicines.  Any blood disorders you have.  Any surgeries you have had.  Any medical conditions you have.  Whether you are pregnant or may be pregnant.  Whether you have been diagnosed with an STI (sexually transmitted infection) or you think you have an STI. What are the risks? Generally, this is a safe procedure. However, problems may occur, including:  Excessive bleeding.  Infection.  Damage to the uterus or other structures or organs.  Allergic reaction to medicines or fluids that are used in the procedure. What happens before the procedure? Staying hydrated Follow instructions from your health care provider about hydration, which may include:  Up to 2 hours before the procedure - you may continue to drink clear liquids, such as water, clear fruit juice, black coffee, and plain tea. Eating and drinking restrictions Follow instructions from your health care provider about eating and  drinking, which may include:  8 hours before the procedure - stop eating solid foods and drink clear liquids only.  2 hours before the procedure - stop drinking clear liquids. Medicines  Ask your health care provider about: ? Changing or stopping your regular medicines. This is especially important if you are taking diabetes medicines or blood thinners. ? Taking medicines such as aspirin and ibuprofen. These medicines can thin your blood. Do not take these medicines unless your health care provider tells you to take them. ? Taking over-the-counter medicines, vitamins, herbs, and supplements.  Medicine may be placed in your cervix the day before the procedure. This medicine causes the cervix to open (dilate). The larger opening makes it easier for the hysteroscope to be inserted into the uterus during the procedure. General instructions  Ask your health care provider: ? What steps will be taken to help prevent infection. These steps may include:  Washing skin with a germ-killing soap.  Taking antibiotic medicine.  Do not use any products that contain nicotine or tobacco for at least 4 weeks before the procedure. These products include cigarettes, chewing tobacco, and vaping devices, such as e-cigarettes. If you need help quitting, ask your health care provider.  Plan to have a responsible adult take you home from the hospital or clinic.  Plan to have a responsible adult care for you for the time you are told after you leave the hospital or clinic. This is important.  Empty your bladder before the procedure begins. What happens during the procedure?  An IV will be inserted into one of your veins.  You may be given: ?   A medicine to help you relax (sedative). ? A medicine that numbs the area around the cervix (local anesthetic). ? A medicine to make you fall asleep (general anesthetic).  A hysteroscope will be inserted through your vagina and into your uterus.  Air or fluid will  be used to enlarge your uterus to allow your health care provider to see it better. The amount of fluid used will be carefully checked throughout the procedure.  In some cases, tissue may be gently scraped from inside the uterus and sent to a lab for testing (biopsy). The procedure may vary among health care providers and hospitals. What can I expect after the procedure?  Your blood pressure, heart rate, breathing rate, and blood oxygen level will be monitored until you leave the hospital or clinic.  You may have cramps. You may be given medicines for this.  You may have bleeding, which may vary from light spotting to menstrual-like bleeding. This is normal.  If you had a biopsy, it is up to you to get the results. Ask your health care provider, or the department that is doing the procedure, when your results will be ready. Follow these instructions at home: Activity  Rest as told by your health care provider.  Return to your normal activities as told by your health care provider. Ask your health care provider what activities are safe for you.  If you were given a sedative during the procedure, it can affect you for several hours. Do not drive or operate machinery until your health care provider says that it is safe. Medicines  Do not take aspirin or other NSAIDs during recovery, as told by your healthcare provider. It can increase the risk of bleeding.  Ask your health care provider if the medicine prescribed to you: ? Requires you to avoid driving or using machinery. ? Can cause constipation. You may need to take these actions to prevent or treat constipation:  Drink enough fluid to keep your urine pale yellow.  Take over-the-counter or prescription medicines.  Eat foods that are high in fiber, such as beans, whole grains, and fresh fruits and vegetables.  Limit foods that are high in fat and processed sugars, such as fried or sweet foods. General instructions  Do not douche,  use tampons, or have sex for 2 weeks after the procedure, or until your health care provider approves.  Do not take baths, swim, or use a hot tub until your health care provider approves. Take showers instead of baths for 2 weeks, or for as long as told by your health care provider.  Keep all follow-up visits. This is important. Contact a health care provider if:  You feel dizzy or lightheaded.  You feel nauseous.  You have abnormal vaginal discharge.  You have a rash.  You have pain that does not get better with medicine.  You have chills. Get help right away if:  You have bleeding that is heavier than a normal menstrual period.  You have a fever.  You have pain or cramps that get worse.  You develop new abdominal pain.  You faint.  You have pain in your shoulder.  You are short of breath. Summary  Hysteroscopy is a procedure that is used to look inside a woman's womb (uterus).  After the procedure, you may have bleeding, which varies from light spotting to menstrual-like bleeding. This is normal. You may also have cramps.  Do not douche, use tampons, or have sex for 2 weeks after   the procedure, or until your health care provider approves.  Plan to have a responsible adult take you home from the hospital or clinic. This information is not intended to replace advice given to you by your health care provider. Make sure you discuss any questions you have with your health care provider. Document Revised: 06/16/2020 Document Reviewed: 06/16/2020 Elsevier Patient Education  2021 [employer]  

## 2020-12-16 ENCOUNTER — Telehealth: Payer: Self-pay

## 2020-12-16 NOTE — Telephone Encounter (Signed)
Pt called and said she saw [facility] on Monday, 1/31. She said that they talked about scheduling surgery.  I adv that have not rec'd a request yet. I told her that she was CRS last pt  On Monday and that she was in the OR Tuesday and out of the office yesterday. I told her I will call when I receive the request.

## 2020-12-16 NOTE — Telephone Encounter (Signed)
sent 

## 2020-12-24 ENCOUNTER — Telehealth: Payer: Self-pay

## 2020-12-24 NOTE — Telephone Encounter (Signed)
Called patient to schedule Hysteroscopy D&C, IUD removal w Jean Rosenthal  DOS 2/22  H&P n/a - pt not interested in re-trying to remove IUD in office     Covid testing 2/18 @ 8-10:30, [address], drive up and wear mask. Advised pt to quarantine until DOS.  Pre-admit phone call appointment to be requested. I will call her to let her know when. Explained that this appointment has a call window. Based on the time scheduled will indicate if the call will be received within a 4 hour window before 1:00 or after.  Advised that pt may also receive calls from the hospital pharmacy and pre-service center.  Confirmed pt has [employer] as Editor, commissioning. No secondary insurance.

## 2020-12-24 NOTE — Telephone Encounter (Signed)
-----   Message from Conard Novak, MD sent at 12/17/2020 10:50 AM EST ----- Regarding: Schedule surgery Surgery Booking Request Patient Full Name:  Martha Pittman  MRN: [id number]  DOB: May 13, 1976  Surgeon: Thomasene Mohair, MD  Requested Surgery Date and Time: TBD Primary Diagnosis AND Code:  1) Attempted IUD removal, unsuccessful [Z54.8, Z97.5] Secondary Diagnosis and Code:  Surgical Procedure: Hysteroscopy, dilation and curettage, IUD removal RNFA Requested?: No L&D Notification: No Admission Status: same day surgery Length of Surgery: 50 min Special Case Needs: Yes, lacrimal duct dilators H&P: Yes Phone Interview???:  Yes Interpreter: No Medical Clearance:  No Special Scheduling Instructions: No Any known health/anesthesia issues, diabetes, sleep apnea, latex allergy, defibrillator/pacemaker?: No Acuity: P3   (P1 highest, P2 delay may cause harm, P3 low, elective gyn, P4 lowest)

## 2020-12-29 ENCOUNTER — Other Ambulatory Visit: Payer: Self-pay

## 2020-12-29 ENCOUNTER — Encounter
Admission: RE | Admit: 2020-12-29 | Discharge: 2020-12-29 | Disposition: A | Discharge status: Home / Self Care | Payer: BC Managed Care – PPO | Source: Ambulatory Visit | Attending: Obstetrics and Gynecology | Admitting: Obstetrics and Gynecology

## 2020-12-29 NOTE — Patient Instructions (Signed)
Your procedure is scheduled on: Tuesday, February 22 Report to the Registration Desk on the 1st floor of the [facility] To find out your arrival time, please call [phone] between 1PM - 3PM on: Monday, February 21  REMEMBER: Instructions that are not followed completely may result in serious medical risk, up to and including death; or upon the discretion of your surgeon and anesthesiologist your surgery may need to be rescheduled.  Do not eat food after midnight the night before surgery.  No gum chewing, lozengers or hard candies.  You may however, drink CLEAR liquids up to 2 hours before you are scheduled to arrive for your surgery. Do not drink anything within 2 hours of your scheduled arrival time.  Clear liquids include: - water  - apple juice without pulp - gatorade (not RED, PURPLE, OR BLUE) - black coffee or tea (Do NOT add milk or creamers to the coffee or tea) Do NOT drink anything that is not on this list.  In addition, your doctor has ordered for you to drink the provided  Ensure Pre-Surgery Clear Carbohydrate Drink  Drinking this carbohydrate drink up to two hours before surgery helps to reduce insulin resistance and improve patient outcomes. Please complete drinking 2 hours prior to scheduled arrival time.  DO NOT TAKE ANY MEDICATIONS THE MORNING OF SURGERY   One week prior to surgery: STARTING February 15 Stop Anti-inflammatories (NSAIDS) such as Advil, Aleve, Ibuprofen, Motrin, Naproxen, Naprosyn and Aspirin based products such as Excedrin, Goodys Powder, BC Powder. Stop ANY OVER THE COUNTER supplements until after surgery.  No Alcohol for 24 hours before or after surgery.  No Smoking including e-cigarettes for 24 hours prior to surgery.  No chewable tobacco products for at least 6 hours prior to surgery.  No nicotine patches on the day of surgery.  Do not use any "recreational" drugs for at least a week prior to your surgery.  Please be advised that the  combination of cocaine and anesthesia may have negative outcomes, up to and including death. If you test positive for cocaine, your surgery will be cancelled.  On the morning of surgery brush your teeth with toothpaste and water, you may rinse your mouth with mouthwash if you wish. Do not swallow any toothpaste or mouthwash.  Do not wear jewelry, make-up, hairpins, clips or nail polish.  Do not wear lotions, powders, or perfumes.   Do not shave body from the neck down 48 hours prior to surgery just in case you cut yourself which could leave a site for infection.  Also, freshly shaved skin may become irritated if using the CHG soap.  Contact lenses, hearing aids and dentures may not be worn into surgery.  Do not bring valuables to the hospital. [facility] is not responsible for any missing/lost belongings or valuables.   Notify your doctor if there is any change in your medical condition (cold, fever, infection).  Wear comfortable clothing (specific to your surgery type) to the hospital.  Plan for stool softeners for home use; pain medications have a tendency to cause constipation. You can also help prevent constipation by eating foods high in fiber such as fruits and vegetables and drinking plenty of fluids as your diet allows.  After surgery, you can help prevent lung complications by doing breathing exercises.  Take deep breaths and cough every 1-2 hours. Your doctor may order a device called an Incentive Spirometer to help you take deep breaths.  If you are being discharged the day  of surgery, you will not be allowed to drive home. You will need a responsible adult (18 years or older) to drive you home and stay with you that night.   If you are taking public transportation, you will need to have a responsible adult (18 years or older) with you. Please confirm with your physician that it is acceptable to use public transportation.   Please call the [place] Dept. at  [phone] if you have any questions about these instructions.  Visitation Policy:  Patients undergoing a surgery or procedure may have one family member or support person with them as long as that person is not COVID-19 positive or experiencing its symptoms.  That person may remain in the waiting area during the procedure.

## 2020-12-31 ENCOUNTER — Other Ambulatory Visit: Payer: BC Managed Care – PPO

## 2020-12-31 ENCOUNTER — Other Ambulatory Visit: Admission: RE | Admit: 2020-12-31 | Payer: BC Managed Care – PPO | Source: Ambulatory Visit

## 2021-01-03 ENCOUNTER — Telehealth: Payer: Self-pay | Admitting: Obstetrics and Gynecology

## 2021-01-03 NOTE — Telephone Encounter (Signed)
Patient called in and is wanting to have her procedure cancelled for 01/04/2021. Spoke with Tresa Endo and she is letting OR know to cancel procedure.

## 2021-01-04 ENCOUNTER — Ambulatory Visit
Admission: RE | Admit: 2021-01-04 | Payer: BC Managed Care – PPO | Source: Home / Self Care | Admitting: Obstetrics and Gynecology

## 2021-01-04 ENCOUNTER — Encounter: Admission: RE | Payer: Self-pay | Source: Home / Self Care

## 2021-01-04 SURGERY — DILATATION AND CURETTAGE /HYSTEROSCOPY
Anesthesia: Choice

## 2021-01-11 ENCOUNTER — Other Ambulatory Visit: Payer: Self-pay

## 2021-01-11 ENCOUNTER — Ambulatory Visit (INDEPENDENT_AMBULATORY_CARE_PROVIDER_SITE_OTHER): Payer: BC Managed Care – PPO | Admitting: Obstetrics and Gynecology

## 2021-01-11 ENCOUNTER — Encounter: Payer: Self-pay | Admitting: Obstetrics and Gynecology

## 2021-01-11 VITALS — BP 124/74 | Ht 59.0 in | Wt 180.0 lb

## 2021-01-11 DIAGNOSIS — Z30432 Encounter for removal of intrauterine contraceptive device: Secondary | ICD-10-CM

## 2021-01-11 NOTE — Progress Notes (Signed)
    IUD Removal  Patient identified, informed consent performed, consent signed.  Patient was in the dorsal lithotomy position, normal external genitalia was noted.  A speculum was placed in the patient's vagina, normal discharge was noted, no lesions. The cervix was visualized, no lesions, no abnormal discharge.  The strings of the IUD were not visualized, so using ultrasound guidance, the cervix was gradually dilated until the IUD "hook" could be passed through the cervix. The IUD was removed intact.  Patient tolerated the procedure well.    Patient will decide what to utilize for contraception.  Routine preventative health maintenance measures emphasized.   Thomasene Mohair, MD, [facility], [facility] 01/11/2021 11:35 AM

## 2021-02-16 ENCOUNTER — Telehealth: Payer: Self-pay

## 2021-02-16 NOTE — Progress Notes (Signed)
Chief Complaint  Patient presents with  . Contraception    Nexplanon insertion  . Urinary Tract Infection    Odor, frequency, no burning x 2-3 days     HPI:      [name] is a 45 y.o. [id number] whose LMP was Patient's last menstrual period was 02/10/2021 (exact date)., presents today for UTI sx of urine odor, frequency, urgency for the past few days. No hematuria, LBP, pelvic pain, fevers. No vag sx. Hx of UTIs in past.  Also here for nexplanon insertion. Had IUD with lost strings and took 3 attempts at removal.   History reviewed. No pertinent past medical history.  Past Surgical History:  Procedure Laterality Date  . BREAST SURGERY  2009   Cyst removed from left breast  . CESAREAN SECTION  1996, 2001,2013    Family History  Problem Relation Age of Onset  . Breast cancer Mother 43  . Lung cancer Maternal Grandmother   . Ovarian cancer Neg Hx     Social History   Socioeconomic History  . Marital status: Single    Spouse name: Not on file  . Number of children: 3  . Years of education: Not on file  . Highest education level: Not on file  Occupational History  . Not on file  Tobacco Use  . Smoking status: Never Smoker  . Smokeless tobacco: Never Used  Vaping Use  . Vaping Use: Never used  Substance and Sexual Activity  . Alcohol use: Yes    Comment: rare  . Drug use: No  . Sexual activity: Not Currently  Other Topics Concern  . Not on file  Social History Narrative  . Not on file   Social Determinants of Health   Financial Resource Strain: Not on file  Food Insecurity: Not on file  Transportation Needs: Not on file  Physical Activity: Not on file  Stress: Not on file  Social Connections: Not on file  Intimate Partner Violence: Not on file    Outpatient Medications Prior to Visit  Medication Sig Dispense Refill  . acetaminophen (TYLENOL) 500 MG tablet Take 1,000 mg by mouth every 6 (six) hours as needed for moderate pain or headache.    .  ibuprofen (ADVIL) 200 MG tablet Take 200 mg by mouth every 6 (six) hours as needed for headache or moderate pain.     No facility-administered medications prior to visit.      ROS:  Review of Systems  Constitutional: Negative for fever.  Gastrointestinal: Negative for blood in stool, constipation, diarrhea, nausea and vomiting.  Genitourinary: Positive for frequency and urgency. Negative for dyspareunia, dysuria, flank pain, hematuria, vaginal bleeding, vaginal discharge and vaginal pain.  Musculoskeletal: Negative for back pain.  Skin: Negative for rash.   BREAST: No symptoms   OBJECTIVE:   Vitals:  BP 110/70   Ht 4\' 11"  (1.499 m)   Wt 176 lb (79.8 kg)   LMP 02/10/2021 (Exact Date)   BMI 35.55 kg/m   Physical Exam Vitals reviewed.  Constitutional:      Appearance: She is well-developed. She is not ill-appearing or toxic-appearing.  Pulmonary:     Effort: Pulmonary effort is normal.  Musculoskeletal:        General: Normal range of motion.     Cervical back: Normal range of motion.  Neurological:     General: No focal deficit present.     Mental Status: She is alert and oriented to person, place, and time.  Cranial Nerves: No cranial nerve deficit.  Psychiatric:        Behavior: Behavior normal.        Thought Content: Thought content normal.        Judgment: Judgment normal.     Results: Results for orders placed or performed in visit on 02/17/21 (from the past 24 hour(s))  POCT Urinalysis Dipstick     Status: Abnormal   Collection Time: 02/17/21 11:03 AM  Result Value Ref Range   Color, UA yellow    Clarity, UA clear    Glucose, UA Negative Negative   Bilirubin, UA neg    Ketones, UA neg    Spec Grav, UA 1.015 1.010 - 1.025   Blood, UA neg    pH, UA 6.5 5.0 - 8.0   Protein, UA Negative Negative   Urobilinogen, UA     Nitrite, UA pos    Leukocytes, UA Negative Negative   Appearance     Odor     Nexplanon Insertion  Patient given informed  consent, signed copy in the chart, time out was performed.  Appropriate time out taken.  Patient's LEFT arm was prepped and draped in the usual sterile fashion. The ruler used to measure and mark insertion area.  Pt was prepped with betadine swab and then injected with 1.0 cc of 2% lidocaine with epinephrine. Nexplanon removed form packaging,  Device confirmed in needle, then inserted full length of needle and withdrawn per handbook instructions.  Pt insertion site covered with steri-strip and a bandage.   Minimal blood loss.  Pt tolerated the procedure welL.   Assessment/Plan: UTI symptoms - Plan: POCT Urinalysis Dipstick, Urine Culture, nitrofurantoin, macrocrystal-monohydrate, (MACROBID) 100 MG capsule; pos sx and UA. Rx macrobid. Check C&S. F/u prn.  Nexplanon insertion - Plan: etonogestrel (NEXPLANON) 68 MG IMPL implant-- She was told to remove the dressing in 12-24 hours, to keep the incision area dry for 24 hours and to remove the Steristrip in 2-3  days.  Notify us if any signs of tenderness, redness, pain, or fevers develop.    Meds ordered this encounter  Medications  . nitrofurantoin, macrocrystal-monohydrate, (MACROBID) 100 MG capsule    Sig: Take 1 capsule (100 mg total) by mouth 2 (two) times daily for 5 days.    Dispense:  10 capsule    Refill:  0    Order Specific Question:   Supervising Provider    Answer:   Nadara Mustard [id number]  . etonogestrel (NEXPLANON) 68 MG IMPL implant    Sig: 1 each (68 mg total) by Subdermal route once for 1 dose.    Dispense:  1 each    Refill:  0    Order Specific Question:   Supervising Provider    Answer:   Nadara Mustard [[id number]]      Return in about 2 months (around 04/19/2021) for annual.  Leon Goodnow B. Sixto Bowdish, PA-C 02/17/2021 11:05 AM

## 2021-02-16 NOTE — Patient Instructions (Addendum)
I value your feedback and you entrusting us with your care. If you get a Martha Pittman patient survey, I would appreciate you taking the time to let us know about your experience today. Thank you!  Remove the dressing in 24 hours,  keep the incision area dry for 24 hours and remove the Steristrip in 2-3  days.  Notify us if any signs of tenderness, redness, pain, or fevers develop.   

## 2021-02-16 NOTE — Telephone Encounter (Signed)
Patient is scheduled for nexplanon placement on 02/17/21 with ABC at 9:50

## 2021-02-17 ENCOUNTER — Ambulatory Visit (INDEPENDENT_AMBULATORY_CARE_PROVIDER_SITE_OTHER): Payer: BC Managed Care – PPO | Admitting: Obstetrics and Gynecology

## 2021-02-17 ENCOUNTER — Encounter: Payer: Self-pay | Admitting: Obstetrics and Gynecology

## 2021-02-17 ENCOUNTER — Other Ambulatory Visit: Payer: Self-pay

## 2021-02-17 VITALS — BP 110/70 | Ht 59.0 in | Wt 176.0 lb

## 2021-02-17 DIAGNOSIS — Z30017 Encounter for initial prescription of implantable subdermal contraceptive: Secondary | ICD-10-CM

## 2021-02-17 DIAGNOSIS — R399 Unspecified symptoms and signs involving the genitourinary system: Secondary | ICD-10-CM

## 2021-02-17 LAB — POCT URINALYSIS DIPSTICK
Bilirubin, UA: NEGATIVE
Blood, UA: NEGATIVE
Glucose, UA: NEGATIVE
Ketones, UA: NEGATIVE
Leukocytes, UA: NEGATIVE
Nitrite, UA: POSITIVE
Protein, UA: NEGATIVE
Spec Grav, UA: 1.015 (ref 1.010–1.025)
pH, UA: 6.5 (ref 5.0–8.0)

## 2021-02-17 MED ORDER — ETONOGESTREL 68 MG ~~LOC~~ IMPL: 1.0000 | DRUG_IMPLANT | Freq: Once | SUBCUTANEOUS | 0 refills | Status: DC

## 2021-02-17 MED ORDER — NITROFURANTOIN MONOHYD MACRO 100 MG PO CAPS: 100.0000 mg | ORAL_CAPSULE | Freq: Two times a day (BID) | ORAL | 0 refills | Status: AC

## 2021-02-17 NOTE — Telephone Encounter (Signed)
Noted. Nexplanon rcvd/charged 02/17/21

## 2021-02-21 LAB — URINE CULTURE

## 2023-07-14 ENCOUNTER — Emergency Department: Payer: BC Managed Care – PPO

## 2023-07-14 ENCOUNTER — Other Ambulatory Visit: Payer: Self-pay

## 2023-07-14 ENCOUNTER — Emergency Department
Admission: EM | Admit: 2023-07-14 | Discharge: 2023-07-14 | Disposition: A | Discharge status: Home / Self Care | Payer: BC Managed Care – PPO | Attending: Emergency Medicine | Admitting: Emergency Medicine

## 2023-07-14 DIAGNOSIS — Y92410 Unspecified street and highway as the place of occurrence of the external cause: Secondary | ICD-10-CM | POA: Diagnosis not present

## 2023-07-14 DIAGNOSIS — R0789 Other chest pain: Secondary | ICD-10-CM | POA: Insufficient documentation

## 2023-07-14 DIAGNOSIS — M545 Low back pain, unspecified: Secondary | ICD-10-CM | POA: Diagnosis present

## 2023-07-14 MED ORDER — MELOXICAM 15 MG PO TABS: 15.0000 mg | ORAL_TABLET | Freq: Every day | ORAL | 1 refills | Status: AC

## 2023-07-14 MED ORDER — METHOCARBAMOL 500 MG PO TABS: 500.0000 mg | ORAL_TABLET | Freq: Three times a day (TID) | ORAL | 0 refills | Status: AC | PRN
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
## 2023-10-08 ENCOUNTER — Telehealth: Payer: Self-pay

## 2023-10-08 NOTE — Progress Notes (Unsigned)
    GYNECOLOGY PROGRESS NOTE  Subjective:  PCP: [facility]  Patient ID: KENYLA LAMASTUS, female    DOB: [birth year], 47 y.o.   MRN: [id number]  HPI  Patient is a 47 y.o. [phone] female who presents for AUB. Has been bleeding for the past 20 days, LMP 09/18/23 and still bleeding daily, flow is heavy with clots, is changing pad Q2hrs. No painful cramping. Periods are normally Q30 days, lasting 4-5 days and flow is light to moderate.  This has never happened before. Has Nexplanon in place since 02/2021 and cycles have been regular with device. Is not dizzy or lightheaded, no palpitations or weakness.   The following portions of the patient's history were reviewed and updated as appropriate: allergies, current medications, past family history, past medical history, past social history, past surgical history, and problem list.  Review of Systems Pertinent items are noted in HPI.   Objective:   Blood pressure 120/80, pulse 89, height 4\' 11"  (1.499 m), weight 187 lb (84.8 kg), last menstrual period 09/18/2023. Body mass index is 37.77 kg/m.  General appearance: alert and cooperative Abdomen: soft, non-tender; bowel sounds normal; no masses,  no organomegaly Pelvic: cervix normal in appearance, external genitalia normal, no adnexal masses or tenderness, no cervical motion tenderness, rectovaginal septum normal, uterus normal size, shape, and consistency, and vagina normal without discharge Extremities: extremities normal, atraumatic, no cyanosis or edema Neurologic: Grossly normal   Endometrial Biopsy Procedure  The patient was positioned on the exam table in the dorsal lithotomy position. Bimanual exam confirmed uterine position and size. A Graves speculum was placed into the vagina. A single toothed tenaculum was placed onto the anterior lip of the cervix. The pipette was placed into the endocervical canal and advanced to the uterine fundus. Using a piston like technique, with vacuum  created by withdrawing the stylus, the endometrial specimen was obtained and transferred to the biopsy container. Minimal bleeding encountered. The procedure was well tolerated.   Uterine Position: anterior   Uterine Length: 7   Uterine Specimen: Average  Assessment/Plan:   1. Abnormal uterine bleeding (AUB)   2. Nexplanon in place    Discussed management options for abnormal uterine bleeding including expectant, NSAIDs, tranexamic acid (Lysteda), oral progesterone (Provera, norethindrone, megace), Depo Provera, Levonorgestrel containing IUD, endometrial ablation (Novasure) or hysterectomy as definitive surgical management.  Discussed risks and benefits of each method.   Final management decision will hinge on results of patient's work up and whether an underlying etiology for the patients bleeding symptoms can be discerned.  We will conduct a basic work up examining using the PALM-COIEN classification system.  In the meantime the patient opts to trial Provera while we await results of her ultrasound and labs.  Printed patient education handouts were given to the patient to review at home.  Bleeding precautions reviewed.    Julieanne Manson, DO [facility]

## 2023-10-08 NOTE — Telephone Encounter (Signed)
Patient states her cycles usually last 5 days. She reports her LMP 09/19/23 and was normal and stopped for 2 days. On day three it started again and hasn't stopped except for two days. This is the first time it has happened. She is using Nexplanon for contraception. She is passing clots. She has not completely filled a pad and had to change it more than one time per

## 2023-10-09 ENCOUNTER — Other Ambulatory Visit (HOSPITAL_COMMUNITY)
Admission: RE | Admit: 2023-10-09 | Discharge: 2023-10-09 | Disposition: A | Discharge status: Home / Self Care | Payer: BC Managed Care – PPO | Source: Ambulatory Visit | Attending: Obstetrics | Admitting: Obstetrics

## 2023-10-09 ENCOUNTER — Other Ambulatory Visit (HOSPITAL_COMMUNITY)
Admission: RE | Admit: 2023-10-09 | Discharge: 2023-10-09 | Disposition: A | Discharge status: Home / Self Care | Payer: BC Managed Care – PPO | Source: Ambulatory Visit

## 2023-10-09 ENCOUNTER — Encounter: Payer: Self-pay | Admitting: Obstetrics

## 2023-10-09 ENCOUNTER — Ambulatory Visit (INDEPENDENT_AMBULATORY_CARE_PROVIDER_SITE_OTHER): Payer: BC Managed Care – PPO | Admitting: Obstetrics

## 2023-10-09 VITALS — BP 120/80 | HR 89 | Ht 59.0 in | Wt 187.0 lb

## 2023-10-09 DIAGNOSIS — N859 Noninflammatory disorder of uterus, unspecified: Secondary | ICD-10-CM

## 2023-10-09 DIAGNOSIS — Z975 Presence of (intrauterine) contraceptive device: Secondary | ICD-10-CM | POA: Diagnosis not present

## 2023-10-09 DIAGNOSIS — N939 Abnormal uterine and vaginal bleeding, unspecified: Secondary | ICD-10-CM | POA: Insufficient documentation

## 2023-10-09 MED ORDER — MEDROXYPROGESTERONE ACETATE 10 MG PO TABS: 10.0000 mg | ORAL_TABLET | Freq: Every day | ORAL | 0 refills | Status: DC

## 2023-10-10 LAB — CBC
Hematocrit: 39.6 % (ref 34.0–46.6)
Hemoglobin: 13 g/dL (ref 11.1–15.9)
MCH: 30.7 pg (ref 26.6–33.0)
MCHC: 32.8 g/dL (ref 31.5–35.7)
MCV: 93 fL (ref 79–97)
Platelets: 406 10*3/uL (ref 150–450)
RBC: 4.24 x10E6/uL (ref 3.77–5.28)
RDW: 12.2 % (ref 11.7–15.4)
WBC: 8 10*3/uL (ref 3.4–10.8)

## 2023-10-10 LAB — CERVICOVAGINAL ANCILLARY ONLY
Bacterial Vaginitis (gardnerella): POSITIVE — AB
Candida Glabrata: NEGATIVE
Candida Vaginitis: NEGATIVE
Chlamydia: NEGATIVE
Comment: NEGATIVE
Comment: NEGATIVE
Comment: NEGATIVE
Comment: NEGATIVE
Comment: NEGATIVE
Comment: NORMAL
Neisseria Gonorrhea: NEGATIVE
Trichomonas: NEGATIVE

## 2023-10-10 LAB — SURGICAL PATHOLOGY

## 2023-10-10 LAB — HCG, SERUM, QUALITATIVE: hCG,Beta Subunit,Qual,Serum: NEGATIVE m[IU]/mL (ref <6)

## 2023-10-10 LAB — TSH: TSH: 1.42 u[IU]/mL (ref 0.450–4.500)

## 2023-10-10 LAB — FOLLICLE STIMULATING HORMONE: FSH: 10 m[IU]/mL

## 2023-10-24 ENCOUNTER — Other Ambulatory Visit: Payer: Self-pay | Admitting: Obstetrics

## 2023-10-24 ENCOUNTER — Ambulatory Visit: Payer: BC Managed Care – PPO | Admitting: Obstetrics

## 2023-10-24 DIAGNOSIS — N939 Abnormal uterine and vaginal bleeding, unspecified: Secondary | ICD-10-CM

## 2023-11-02 ENCOUNTER — Other Ambulatory Visit: Payer: BC Managed Care – PPO

## 2023-11-13 ENCOUNTER — Ambulatory Visit: Payer: BC Managed Care – PPO | Admitting: Obstetrics

## 2023-11-13 ENCOUNTER — Encounter: Payer: Self-pay | Admitting: Obstetrics

## 2023-11-15 ENCOUNTER — Encounter: Payer: Self-pay | Admitting: Obstetrics
